# Patient Record
Sex: Male | Born: 2005 | Race: White | Hispanic: Yes | Marital: Single | Smoking: Never smoker
Health system: Southern US, Community
[De-identification: ages and names within clinical notes are randomized; demographics above are authoritative.]

## PROBLEM LIST (undated history)

## (undated) DIAGNOSIS — J45909 Unspecified asthma, uncomplicated: Secondary | ICD-10-CM

---
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
## 2024-01-07 ENCOUNTER — Other Ambulatory Visit: Payer: Self-pay

## 2024-01-07 ENCOUNTER — Emergency Department (HOSPITAL_COMMUNITY)
Admission: EM | Admit: 2024-01-07 | Discharge: 2024-01-07 | Disposition: A | Discharge status: Home / Self Care | Attending: Emergency Medicine | Admitting: Emergency Medicine

## 2024-01-07 DIAGNOSIS — L0291 Cutaneous abscess, unspecified: Secondary | ICD-10-CM

## 2024-01-07 DIAGNOSIS — L02415 Cutaneous abscess of right lower limb: Secondary | ICD-10-CM | POA: Diagnosis not present

## 2024-01-07 DIAGNOSIS — M7989 Other specified soft tissue disorders: Secondary | ICD-10-CM | POA: Diagnosis present

## 2024-01-07 MED ORDER — LIDOCAINE HCL (PF) 1 % IJ SOLN
10.0000 mL | Freq: Once | INTRAMUSCULAR | Status: DC
  Filled 2024-01-07: qty 10

## 2024-01-07 NOTE — ED Provider Notes (Signed)
 [facility] EMERGENCY DEPARTMENT AT [facility] Provider Note   CSN: [id number] Arrival date & time: 01/07/24  1031     Patient presents with: Abscess and Leg Pain   Steve Bridges is a 18 y.o. male patient who presents to the emergency department today for further evaluation of right posterior upper leg swelling and pain has been progressively worsening over the last 2 to 3 weeks.  Patient states that he first noticed this as a small pustule but it has since gotten bigger.  Unclear whether or not this has been draining or not.  He denies any fever or chills.  Denies any trauma or injury to the area.    Abscess Leg Pain      Prior to Admission medications   Medication Sig Start Date End Date Taking? Authorizing Provider  Acetaminophen  (TYLENOL  PO) Take 1 tablet by mouth every 6 (six) hours as needed (pain).    [provider]  ceFAZolin  2,000 mg in dextrose  5 % 100 mL Inject 2,000 mg into the vein every 8 (eight) hours. 08/08/18   Kane, Joelle, MD  Dextrose -Sodium Chloride  (DEXTROSE  5 % AND 0.9% NACL) 5-0.9 % infusion Inject 100 mL/hr into the vein continuous. 08/08/18   Kane, Joelle, MD  ibuprofen  (ADVIL ,MOTRIN ) 600 MG tablet Take 600 mg by mouth every 6 (six) hours as needed (pain).    [provider]    Allergies: Patient has no known allergies.    Review of Systems  All other systems reviewed and are negative.   Updated Vital Signs BP 124/76 (BP Location: Right Arm)   Pulse 82   Temp 98.4 F (36.9 C)   Resp 17   Ht 5' 9 (1.753 m)   Wt 72.6 kg   SpO2 100%   BMI 23.63 kg/m   Physical Exam Vitals and nursing note reviewed.  Constitutional:      Appearance: Normal appearance.  HENT:     Head: Normocephalic and atraumatic.  Eyes:     General:        Right eye: No discharge.        Left eye: No discharge.     Conjunctiva/sclera: Conjunctivae normal.  Pulmonary:     Effort: Pulmonary effort is normal.  Skin:    General:  Skin is warm and dry.     Findings: No rash.     Comments: Approximately 7 cm x 2 cm.  Area has some surrounding hyperpigmentation and slight warmth.  Not actively draining.  Neurological:     General: No focal deficit present.     Mental Status: He is alert.  Psychiatric:        Mood and Affect: Mood normal.        Behavior: Behavior normal.     (all labs ordered are listed, but only abnormal results are displayed) Labs Reviewed - No data to display  EKG: None  Radiology: No results found.   SABRAUltrasound ED Soft Tissue  Date/Time: 01/07/2024 12:02 PM  Performed by: Steve Cameron HERO, PA-C Authorized by: Steve Cameron HERO, PA-C   Procedure details:    Indications: localization of abscess     Transverse view:  Visualized   Longitudinal view:  Visualized   Images: not archived   Location:    Location: lower extremity     Side:  Right Findings:     abscess present    cellulitis present .Incision and Drainage  Date/Time: 01/07/2024 1:24 PM  Performed by: Steve Cameron HERO, PA-C  Authorized by: Steve Cameron HERO, PA-C   Consent:    Consent obtained:  Verbal   Consent given by:  Patient   Risks, benefits, and alternatives were discussed: yes     Risks discussed:  Bleeding   Alternatives discussed:  No treatment Universal protocol:    Procedure explained and questions answered to patient or proxy's satisfaction: yes     Relevant documents present and verified: yes     Test results available : no     Imaging studies available: no     Required blood products, implants, devices, and special equipment available: no     Site/side marked: yes     Patient identity confirmed:  Verbally with patient and arm band Location:    Type:  Abscess   Size:  7cm   Location:  Lower extremity   Lower extremity location:  Leg   Leg location:  R upper leg Pre-procedure details:    Skin preparation:  Povidone-iodine Sedation:    Sedation type:  None Anesthesia:    Anesthesia  method:  Local infiltration   Local anesthetic:  Lidocaine  1% w/o epi Procedure type:    Complexity:  Complex Procedure details:    Ultrasound guidance: yes     Incision types:  Single straight   Incision depth:  Dermal   Wound management:  Probed and deloculated and irrigated with saline   Drainage:  Purulent   Drainage amount:  Copious   Packing materials:  None Post-procedure details:    Procedure completion:  Tolerated well, no immediate complications    Medications Ordered in the ED  lidocaine  (PF) (XYLOCAINE ) 1 % injection 10 mL (has no administration in time range)     Medical Decision Making Steve Bridges is a 18 y.o. male patient who presents to the emergency department today for further evaluation of an abscess over the right posterior upper leg.  Visualized this with ultrasound.  I also performed incision and drainage at the bedside.  Copious amounts of purulent drainage.  Please see procedure note for further detail.  Left wound open.  Strict turn precaution were discussed.  Ibuprofen  for pain.  Continue to do warm soaks at home.  He is safer discharge at this time.   Risk Prescription drug management.     Final diagnoses:  Abscess    ED Discharge Orders     None          Steve Cameron [place], [place] 01/07/24 1327    Lenor Hollering, MD 01/07/24 1536

## 2024-01-07 NOTE — Discharge Instructions (Addendum)
 Continue to apply warm soaks 3 times per day for the next several days.  You can use ibuprofen  for pain.  Please return to the emergency department for any worsening symptoms or fever, recurrence of the abscess.

## 2024-01-07 NOTE — ED Triage Notes (Signed)
 Pt. Stated, Jesus got a big bump on the back of my left leg upper for about 2 weeks.

## 2024-01-29 IMAGING — CR DG FINGER LITTLE 2+V*R*
3 series · 3 of 3 positions shown · non-contrast
Comparison: None.

CLINICAL DATA: Hit with basketball today.  Pain.

EXAM:
RIGHT LITTLE FINGER 2+V

[x finger pa right]
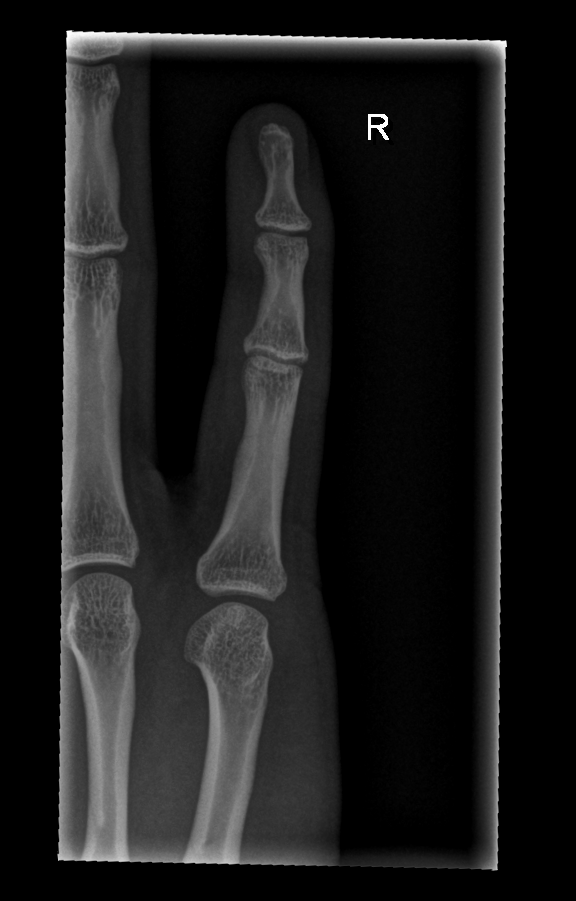

[x finger lat right]
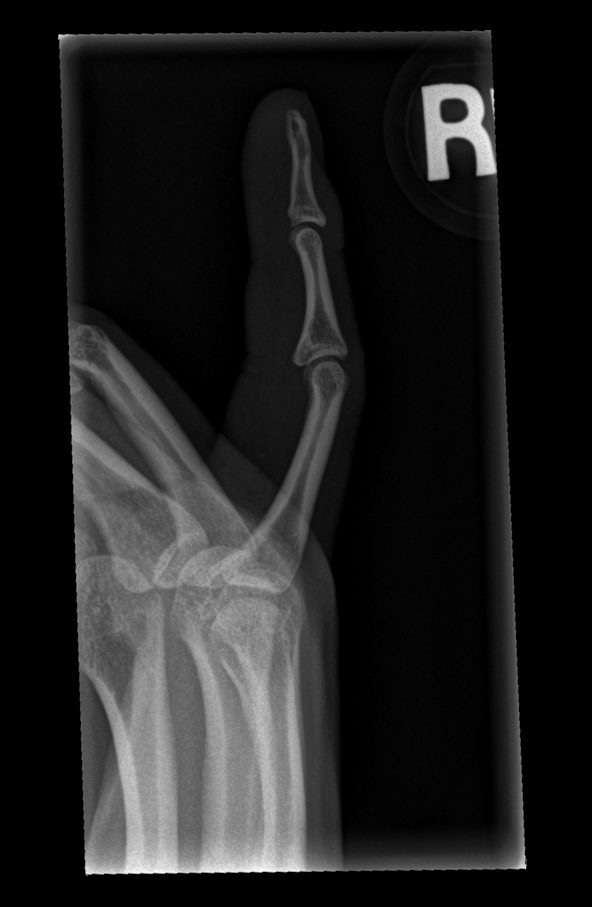

[x finger obl right]
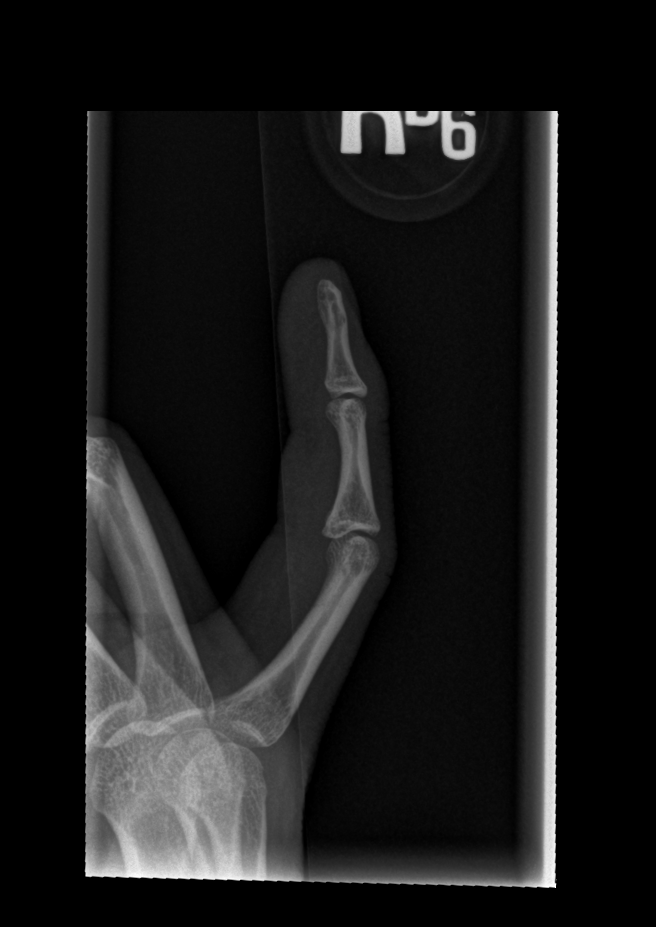

[3 of 3 positions shown; findings below may reference images not displayed]

FINDINGS: There is no evidence of fracture or dislocation. There is no
evidence of arthropathy or other focal bone abnormality. Soft
tissues are unremarkable.
IMPRESSION: Negative.
